# Patient Record
Sex: Male | Born: 2005
Health system: Southern US, Community
[De-identification: ages and names within clinical notes are randomized; demographics above are authoritative.]

## PROBLEM LIST (undated history)

## (undated) DIAGNOSIS — F909 Attention-deficit hyperactivity disorder, unspecified type: Secondary | ICD-10-CM

## (undated) DIAGNOSIS — I10 Essential (primary) hypertension: Secondary | ICD-10-CM

## (undated) HISTORY — DX: Essential (primary) hypertension: I10

## (undated) HISTORY — PX: NO PAST SURGERIES: SHX2092

---
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
## 2023-02-04 ENCOUNTER — Encounter (HOSPITAL_COMMUNITY): Payer: Self-pay | Admitting: Emergency Medicine

## 2023-02-04 ENCOUNTER — Ambulatory Visit (HOSPITAL_COMMUNITY)
Admission: EM | Admit: 2023-02-04 | Discharge: 2023-02-04 | Disposition: A | Discharge status: Home / Self Care | Payer: Medicaid Other | Attending: Internal Medicine | Admitting: Internal Medicine

## 2023-02-04 DIAGNOSIS — W228XXA Striking against or struck by other objects, initial encounter: Secondary | ICD-10-CM | POA: Diagnosis not present

## 2023-02-04 DIAGNOSIS — R55 Syncope and collapse: Secondary | ICD-10-CM | POA: Insufficient documentation

## 2023-02-04 DIAGNOSIS — Z79899 Other long term (current) drug therapy: Secondary | ICD-10-CM | POA: Diagnosis not present

## 2023-02-04 LAB — CBC
HCT: 42.6 % (ref 39.0–52.0)
Hemoglobin: 15.1 g/dL (ref 13.0–17.0)
MCH: 30 pg (ref 26.0–34.0)
MCHC: 35.4 g/dL (ref 30.0–36.0)
MCV: 84.7 fL (ref 80.0–100.0)
Platelets: 228 10*3/uL (ref 150–400)
RBC: 5.03 MIL/uL (ref 4.22–5.81)
RDW: 12.8 % (ref 11.5–15.5)
WBC: 4.8 10*3/uL (ref 4.0–10.5)
nRBC: 0 % (ref 0.0–0.2)

## 2023-02-04 LAB — POCT URINALYSIS DIP (MANUAL ENTRY)
Bilirubin, UA: NEGATIVE
Blood, UA: NEGATIVE
Glucose, UA: NEGATIVE mg/dL
Ketones, POC UA: NEGATIVE mg/dL
Leukocytes, UA: NEGATIVE
Nitrite, UA: NEGATIVE
Protein Ur, POC: NEGATIVE mg/dL
Spec Grav, UA: 1.02 (ref 1.010–1.025)
Urobilinogen, UA: 0.2 U/dL
pH, UA: 7.5 (ref 5.0–8.0)

## 2023-02-04 LAB — COMPREHENSIVE METABOLIC PANEL
ALT: 17 U/L (ref 0–44)
AST: 18 U/L (ref 15–41)
Albumin: 4.4 g/dL (ref 3.5–5.0)
Alkaline Phosphatase: 52 U/L (ref 38–126)
Anion gap: 7 (ref 5–15)
BUN: 7 mg/dL (ref 6–20)
CO2: 27 mmol/L (ref 22–32)
Calcium: 9.4 mg/dL (ref 8.9–10.3)
Chloride: 101 mmol/L (ref 98–111)
Creatinine, Ser: 0.76 mg/dL (ref 0.61–1.24)
GFR, Estimated: >60 mL/min (ref >60)
Glucose, Bld: 88 mg/dL (ref 70–99)
Potassium: 3.9 mmol/L (ref 3.5–5.1)
Sodium: 135 mmol/L (ref 135–145)
Total Bilirubin: 0.7 mg/dL (ref <1.2)
Total Protein: 7.4 g/dL (ref 6.5–8.1)

## 2023-02-04 LAB — POCT FASTING CBG KUC MANUAL ENTRY: POCT Glucose (KUC): 71 mg/dL (ref 70–99)

## 2023-02-04 NOTE — Discharge Instructions (Signed)
Blood work is pending.  We will call if it is abnormal.  Referral to cardiology has been placed.  If they do not call you in the next few days, please call them yourself at provided phone number.  Also follow-up with PCP.  Go to the ER if this occurs again.

## 2023-02-04 NOTE — ED Triage Notes (Signed)
Pt presents after having episodes of syncope. First occurrence was about 1 month ago and he has had them more frequently. He had one episode yesterday and one this morning.   States he blacks out and feels like he is jolting.

## 2023-02-04 NOTE — ED Provider Notes (Addendum)
[facility]    CSN: [id number] Arrival date & time: 02/04/23  1316      History   Chief Complaint Chief Complaint  Patient presents with   Loss of Consciousness    HPI Gregory Sullivan is a 17 y.o. male.   Patient presents after having 3 different syncopal episodes over the past month.  Reports that the first 1 occurred about a month ago.  He had an episode yesterday and today as well.  Some of them have been witnessed and some of them have not.  Reports that he is typically only out for "a few minutes".  He did hit his head today on the door but denies any headache, dizziness, blurred vision, nausea, vomiting.  He does not take any blood thinning medications.  Reports that he typically does not have any associated symptoms prior to the syncopal episodes including chest pain, palpitations, shortness of breath, headache, dizziness.  He denies any pertinent medical history or history of the same.  He does take Vyvanse daily but reports he has been taking this "his whole life" with no adverse effects.  He is eating and drinking appropriately.  He denies that he does any illicit drugs.  He denies that smokes cigarettes but does report that he vapes.   Loss of Consciousness   Past Medical History:  Diagnosis Date   ADHD (attention deficit hyperactivity disorder)     Patient Active Problem List   Diagnosis Date Noted   Hypertension 04/02/2018   Acute diffuse otitis externa of both ears 05/21/2017    History reviewed. No pertinent surgical history.     Home Medications    Prior to Admission medications   Medication Sig Start Date End Date Taking? Authorizing Provider  acetaminophen (TYLENOL) 325 MG tablet Take 2 tablets (650 mg total) by mouth every 6 (six) hours as needed. Patient not taking: Reported on 02/04/2023 09/30/21   Tanda Rockers A, DO  ibuprofen (ADVIL) 600 MG tablet Take 1 tablet (600 mg total) by mouth every 6 (six) hours as needed. Patient not  taking: Reported on 02/04/2023 09/30/21   Tanda Rockers A, DO  lisinopril (ZESTRIL) 10 MG tablet Take 10 mg by mouth daily.    [provider]  VYVANSE 40 MG capsule Take 40 mg by mouth daily. 08/31/21   [provider]    Family History History reviewed. No pertinent family history.  Social History Social History   Tobacco Use   Smoking status: Never    Passive exposure: Yes   Smokeless tobacco: Never  Vaping Use   Vaping status: Never Used  Substance Use Topics   Alcohol use: No   Drug use: No     Allergies   Patient has no known allergies.   Review of Systems Review of Systems  Cardiovascular:  Positive for syncope.  Per HPI   Physical Exam Triage Vital Signs ED Triage Vitals  Encounter Vitals Group     BP 02/04/23 1454 112/73     Systolic BP Percentile --      Diastolic BP Percentile --      Pulse Rate 02/04/23 1454 95     Resp 02/04/23 1454 16     Temp 02/04/23 1454 97.9 F (36.6 C)     Temp Source 02/04/23 1454 Oral     SpO2 02/04/23 1454 98 %     Weight --      Height --      Head Circumference --  Peak Flow --      Pain Score 02/04/23 1459 0     Pain Loc --      Pain Education --      Exclude from Growth Chart --    No data found.  Updated Vital Signs BP 112/73 (BP Location: Right Arm)   Pulse 95   Temp 97.9 F (36.6 C) (Oral)   Resp 16   SpO2 98%   Visual Acuity Right Eye Distance:   Left Eye Distance:   Bilateral Distance:    Right Eye Near:   Left Eye Near:    Bilateral Near:     Physical Exam Constitutional:      General: He is not in acute distress.    Appearance: Normal appearance. He is not toxic-appearing or diaphoretic.  HENT:     Head: Normocephalic and atraumatic.     Comments: No abrasions or lacerations noted.  Eyes:     Extraocular Movements: Extraocular movements intact.     Conjunctiva/sclera: Conjunctivae normal.     Pupils: Pupils are equal, round, and reactive to light.  Cardiovascular:      Rate and Rhythm: Normal rate and regular rhythm.     Pulses: Normal pulses.     Heart sounds: Normal heart sounds.  Pulmonary:     Effort: Pulmonary effort is normal. No respiratory distress.     Breath sounds: Normal breath sounds.  Neurological:     General: No focal deficit present.     Mental Status: He is alert and oriented to person, place, and time. Mental status is at baseline.     Cranial Nerves: Cranial nerves 2-12 are intact.     Sensory: Sensation is intact.     Motor: Motor function is intact.     Coordination: Coordination is intact.     Gait: Gait is intact.  Psychiatric:        Mood and Affect: Mood normal.        Behavior: Behavior normal.        Thought Content: Thought content normal.        Judgment: Judgment normal.      UC Treatments / Results  Labs (all labs ordered are listed, but only abnormal results are displayed) Labs Reviewed  COMPREHENSIVE METABOLIC PANEL  CBC  POCT FASTING CBG [facility] MANUAL ENTRY  POCT URINALYSIS DIP (MANUAL ENTRY)    EKG   Radiology No results found.  Procedures Procedures (including critical care time)  Medications Ordered in UC Medications - No data to display  Initial Impression / Assessment and Plan / UC Course  I have reviewed the triage vital signs and the nursing notes.  Pertinent labs & imaging results that were available during my care of the patient were reviewed by me and considered in my medical decision making (see chart for details).     Patient's neurological examination is normal, he is not in any acute distress, is sitting upright in chair, and vital signs are stable so do not think that emergent evaluation or imaging of the head is necessary at this time.  Symptoms have been intermittent for the past month as well.  EKG was unremarkable.  CBG normal.  UA normal.  CMP and CBC pending.  Awaiting results.  Ambulatory referral to cardiology was placed for syncopal episode evaluation.  Advised patient  that if they do not call in the next few days, he is to call them himself at provided contact information.  Also advised PCP follow-up  as soon as possible and he states that he does have a PCP.  Advised patient to go to the emergency department if this occurs again for further evaluation as he may need imaging of the head.  Although, as discussed previously do not think this is necessary at this time given neuroexam is normal, vital signs are stable, patient does not take any blood thinning medications, patient is not having any neurological symptoms.  Patient verbalized understanding and was agreeable with plan. Final Clinical Impressions(s) / UC Diagnoses   Final diagnoses:  Syncope, unspecified syncope type     Discharge Instructions      Blood work is pending.  We will call if it is abnormal.  Referral to cardiology has been placed.  If they do not call you in the next few days, please call them yourself at provided phone number.  Also follow-up with PCP.  Go to the ER if this occurs again.     ED Prescriptions   None    PDMP not reviewed this encounter.   Gustavus Bryant, [place] 02/04/23 1656    Gustavus Bryant, [place] 02/04/23 [phone]

## 2023-04-17 ENCOUNTER — Ambulatory Visit (HOSPITAL_BASED_OUTPATIENT_CLINIC_OR_DEPARTMENT_OTHER): Payer: Medicaid Other | Admitting: Cardiology

## 2023-05-11 ENCOUNTER — Emergency Department (HOSPITAL_COMMUNITY)

## 2023-05-11 ENCOUNTER — Other Ambulatory Visit: Payer: Self-pay

## 2023-05-11 ENCOUNTER — Emergency Department (HOSPITAL_COMMUNITY)
Admission: EM | Admit: 2023-05-11 | Discharge: 2023-05-11 | Disposition: A | Discharge status: Home / Self Care | Attending: Emergency Medicine | Admitting: Emergency Medicine

## 2023-05-11 ENCOUNTER — Encounter (HOSPITAL_COMMUNITY): Payer: Self-pay | Admitting: *Deleted

## 2023-05-11 DIAGNOSIS — R55 Syncope and collapse: Secondary | ICD-10-CM | POA: Diagnosis present

## 2023-05-11 DIAGNOSIS — I10 Essential (primary) hypertension: Secondary | ICD-10-CM | POA: Insufficient documentation

## 2023-05-11 DIAGNOSIS — Z7722 Contact with and (suspected) exposure to environmental tobacco smoke (acute) (chronic): Secondary | ICD-10-CM | POA: Diagnosis not present

## 2023-05-11 LAB — BASIC METABOLIC PANEL
Anion gap: 14 (ref 5–15)
BUN: 5 mg/dL — ABNORMAL LOW (ref 6–20)
CO2: 24 mmol/L (ref 22–32)
Calcium: 9.5 mg/dL (ref 8.9–10.3)
Chloride: 102 mmol/L (ref 98–111)
Creatinine, Ser: 0.84 mg/dL (ref 0.61–1.24)
GFR, Estimated: >60 mL/min (ref >60)
Glucose, Bld: 94 mg/dL (ref 70–99)
Potassium: 3.4 mmol/L — ABNORMAL LOW (ref 3.5–5.1)
Sodium: 140 mmol/L (ref 135–145)

## 2023-05-11 LAB — URINALYSIS, ROUTINE W REFLEX MICROSCOPIC
Bilirubin Urine: NEGATIVE
Glucose, UA: NEGATIVE mg/dL
Hgb urine dipstick: NEGATIVE
Ketones, ur: NEGATIVE mg/dL
Leukocytes,Ua: NEGATIVE
Nitrite: NEGATIVE
Protein, ur: NEGATIVE mg/dL
Specific Gravity, Urine: 1.018 (ref 1.005–1.030)
pH: 6 (ref 5.0–8.0)

## 2023-05-11 LAB — CBC
HCT: 42.8 % (ref 39.0–52.0)
Hemoglobin: 14.7 g/dL (ref 13.0–17.0)
MCH: 28.9 pg (ref 26.0–34.0)
MCHC: 34.3 g/dL (ref 30.0–36.0)
MCV: 84.3 fL (ref 80.0–100.0)
Platelets: 220 10*3/uL (ref 150–400)
RBC: 5.08 MIL/uL (ref 4.22–5.81)
RDW: 12.5 % (ref 11.5–15.5)
WBC: 5.6 10*3/uL (ref 4.0–10.5)
nRBC: 0 % (ref 0.0–0.2)

## 2023-05-11 LAB — CBG MONITORING, ED: Glucose-Capillary: 83 mg/dL (ref 70–99)

## 2023-05-11 NOTE — Discharge Instructions (Addendum)
 Gregory Sullivan:  Thank you for allowing [place] to take care of you today.  We hope you begin feeling better soon.  To-Do: Please follow-up with cardiology.  They will call in the next few days to schedule an appointment.  However if you have not received a phone call, please call their office directly at [phone]  to schedule an appointment. Please return to the Emergency Department or call 911 if you experience chest pain, shortness of breath, severe pain, severe fever, altered mental status, or have any reason to think that you need emergency medical care.  Thank you again.  Hope you feel better soon.  Department of Emergency Medicine [facility]
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
## 2023-05-11 NOTE — ED Triage Notes (Signed)
 PT states he was feeling fine last night, getting a glass of milk, and the next thing he knew he was laying on the floor, shaking and everything was dark.  Wife stated patient was there laying still for approx 2 minutes.  Today c/o headache.  No hematoma, lac or bleeding noted.  Pupils equal and reactive.  Pt states previous syncopal episodes.

## 2023-05-16 ENCOUNTER — Encounter (HOSPITAL_BASED_OUTPATIENT_CLINIC_OR_DEPARTMENT_OTHER): Payer: Self-pay

## 2023-12-01 ENCOUNTER — Emergency Department (HOSPITAL_COMMUNITY)
Admission: EM | Admit: 2023-12-01 | Discharge: 2023-12-01 | Disposition: A | Discharge status: Home / Self Care | Attending: Emergency Medicine | Admitting: Emergency Medicine

## 2023-12-01 ENCOUNTER — Other Ambulatory Visit: Payer: Self-pay

## 2023-12-01 ENCOUNTER — Encounter (HOSPITAL_COMMUNITY): Payer: Self-pay

## 2023-12-01 ENCOUNTER — Emergency Department (HOSPITAL_COMMUNITY)

## 2023-12-01 DIAGNOSIS — Y9355 Activity, bike riding: Secondary | ICD-10-CM | POA: Insufficient documentation

## 2023-12-01 DIAGNOSIS — S60221A Contusion of right hand, initial encounter: Secondary | ICD-10-CM | POA: Insufficient documentation

## 2023-12-01 DIAGNOSIS — S60512A Abrasion of left hand, initial encounter: Secondary | ICD-10-CM | POA: Insufficient documentation

## 2023-12-01 DIAGNOSIS — T07XXXA Unspecified multiple injuries, initial encounter: Secondary | ICD-10-CM

## 2023-12-01 DIAGNOSIS — Y92410 Unspecified street and highway as the place of occurrence of the external cause: Secondary | ICD-10-CM | POA: Diagnosis not present

## 2023-12-01 DIAGNOSIS — S6991XA Unspecified injury of right wrist, hand and finger(s), initial encounter: Secondary | ICD-10-CM | POA: Diagnosis present

## 2023-12-01 MED ORDER — BACITRACIN ZINC 500 UNIT/GM EX OINT: 1.0000 | TOPICAL_OINTMENT | Freq: Two times a day (BID) | CUTANEOUS | 0 refills | Status: AC

## 2023-12-01 MED ORDER — IBUPROFEN 800 MG PO TABS: 800.0000 mg | ORAL_TABLET | Freq: Three times a day (TID) | ORAL | 0 refills | Status: AC
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
## 2023-12-01 NOTE — ED Notes (Signed)
 Patient with abrasions to knuckles of right hand and palm of the left hand.

## 2023-12-01 NOTE — Discharge Instructions (Addendum)
 1.  Keep your abrasions clean with mild soap and water.  Pat dry.  Apply bacitracin  ointment twice daily.  Apply dressings to protect from any dirt or debris if you are doing activities using your hands. 2.  Take ibuprofen  800 mg every 8 hours for pain control.  If you need additional pain control, you may add over-the-counter extra strength Tylenol  per package instructions.  Try to elevate and ice your hand is much as possible for the next 2 to 3 days.  Sure and ice pack is well wrapped and placed in about 20-minute intervals every couple of hours. 3.  Schedule follow-up appointment with your doctor for recheck.  If you are having persisting problems with pain swelling or mobility of the fingers you may need referral to a hand specialist.
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
